# Patient Record
Sex: Male | Born: 1940 | Race: White | Hispanic: No | Marital: Single | Smoking: Never smoker
Health system: Southern US, Community
[De-identification: ages and names within clinical notes are randomized; demographics above are authoritative.]

## PROBLEM LIST (undated history)

## (undated) DIAGNOSIS — I10 Essential (primary) hypertension: Secondary | ICD-10-CM

## (undated) DIAGNOSIS — M069 Rheumatoid arthritis, unspecified: Secondary | ICD-10-CM

## (undated) DIAGNOSIS — E7800 Pure hypercholesterolemia, unspecified: Secondary | ICD-10-CM

---

## 2015-07-22 ENCOUNTER — Emergency Department (HOSPITAL_COMMUNITY): Payer: No Typology Code available for payment source

## 2015-07-22 ENCOUNTER — Emergency Department (HOSPITAL_COMMUNITY)
Admission: EM | Admit: 2015-07-22 | Discharge: 2015-07-22 | Disposition: A | Discharge status: Home / Self Care | Payer: No Typology Code available for payment source | Attending: Physician Assistant | Admitting: Physician Assistant

## 2015-07-22 ENCOUNTER — Encounter (HOSPITAL_COMMUNITY): Payer: Self-pay | Admitting: *Deleted

## 2015-07-22 DIAGNOSIS — Y9389 Activity, other specified: Secondary | ICD-10-CM | POA: Insufficient documentation

## 2015-07-22 DIAGNOSIS — Z8739 Personal history of other diseases of the musculoskeletal system and connective tissue: Secondary | ICD-10-CM | POA: Insufficient documentation

## 2015-07-22 DIAGNOSIS — S199XXA Unspecified injury of neck, initial encounter: Secondary | ICD-10-CM | POA: Diagnosis not present

## 2015-07-22 DIAGNOSIS — Y998 Other external cause status: Secondary | ICD-10-CM | POA: Diagnosis not present

## 2015-07-22 DIAGNOSIS — Z8639 Personal history of other endocrine, nutritional and metabolic disease: Secondary | ICD-10-CM | POA: Diagnosis not present

## 2015-07-22 DIAGNOSIS — Y92410 Unspecified street and highway as the place of occurrence of the external cause: Secondary | ICD-10-CM | POA: Insufficient documentation

## 2015-07-22 DIAGNOSIS — M542 Cervicalgia: Secondary | ICD-10-CM

## 2015-07-22 DIAGNOSIS — I10 Essential (primary) hypertension: Secondary | ICD-10-CM | POA: Diagnosis not present

## 2015-07-22 HISTORY — DX: Rheumatoid arthritis, unspecified: M06.9

## 2015-07-22 HISTORY — DX: Pure hypercholesterolemia, unspecified: E78.00

## 2015-07-22 HISTORY — DX: Essential (primary) hypertension: I10

## 2015-07-22 LAB — CBC WITH DIFFERENTIAL/PLATELET
Basophils Absolute: 0 10*3/uL (ref 0.0–0.1)
Basophils Relative: 0 %
EOS PCT: 2 %
Eosinophils Absolute: 0.1 10*3/uL (ref 0.0–0.7)
HCT: 43 % (ref 39.0–52.0)
Hemoglobin: 14.5 g/dL (ref 13.0–17.0)
LYMPHS ABS: 1.6 10*3/uL (ref 0.7–4.0)
LYMPHS PCT: 25 %
MCH: 33.6 pg (ref 26.0–34.0)
MCHC: 33.7 g/dL (ref 30.0–36.0)
MCV: 99.8 fL (ref 78.0–100.0)
MONO ABS: 0.8 10*3/uL (ref 0.1–1.0)
MONOS PCT: 13 %
Neutro Abs: 4 10*3/uL (ref 1.7–7.7)
Neutrophils Relative %: 60 %
PLATELETS: 181 10*3/uL (ref 150–400)
RBC: 4.31 MIL/uL (ref 4.22–5.81)
RDW: 13.7 % (ref 11.5–15.5)
WBC: 6.5 10*3/uL (ref 4.0–10.5)

## 2015-07-22 LAB — COMPREHENSIVE METABOLIC PANEL
ALT: 25 U/L (ref 17–63)
AST: 34 U/L (ref 15–41)
Albumin: 3.8 g/dL (ref 3.5–5.0)
Alkaline Phosphatase: 55 U/L (ref 38–126)
Anion gap: 8 (ref 5–15)
BUN: 12 mg/dL (ref 6–20)
CHLORIDE: 97 mmol/L — AB (ref 101–111)
CO2: 29 mmol/L (ref 22–32)
CREATININE: 1.1 mg/dL (ref 0.61–1.24)
Calcium: 9.3 mg/dL (ref 8.9–10.3)
Glucose, Bld: 147 mg/dL — ABNORMAL HIGH (ref 65–99)
POTASSIUM: 3.5 mmol/L (ref 3.5–5.1)
Sodium: 134 mmol/L — ABNORMAL LOW (ref 135–145)
TOTAL PROTEIN: 6.3 g/dL — AB (ref 6.5–8.1)
Total Bilirubin: 1.4 mg/dL — ABNORMAL HIGH (ref 0.3–1.2)

## 2015-07-22 LAB — I-STAT TROPONIN, ED: TROPONIN I, POC: 0.01 ng/mL (ref 0.00–0.08)

## 2015-07-22 MED ORDER — IOHEXOL 350 MG/ML SOLN
80.0000 mL | Freq: Once | INTRAVENOUS | Status: AC | PRN
  Administered 2015-07-22: 80 mL via INTRAVENOUS

## 2015-07-22 NOTE — Discharge Instructions (Signed)
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  This needs immediate attention.  We gave you a name of a vascular surgeon, but you can use one closer to home if it is easier.
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
## 2015-07-22 NOTE — ED Notes (Signed)
Patient ambulated without difficulty. EDP made aware.
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
